# Patient Record
Sex: Male | Born: 1965 | Race: White | Hispanic: No | Smoking: Current every day smoker
Health system: Southern US, Community
[De-identification: ages and names within clinical notes are randomized; demographics above are authoritative.]

---

## 2019-09-30 ENCOUNTER — Encounter (HOSPITAL_BASED_OUTPATIENT_CLINIC_OR_DEPARTMENT_OTHER): Payer: Self-pay | Admitting: *Deleted

## 2019-09-30 ENCOUNTER — Other Ambulatory Visit: Payer: Self-pay

## 2019-09-30 ENCOUNTER — Observation Stay (HOSPITAL_BASED_OUTPATIENT_CLINIC_OR_DEPARTMENT_OTHER)
Admission: EM | Admit: 2019-09-30 | Discharge: 2019-10-02 | Disposition: A | Discharge status: Home / Self Care | Payer: Self-pay | Attending: Internal Medicine | Admitting: Internal Medicine

## 2019-09-30 DIAGNOSIS — Z20828 Contact with and (suspected) exposure to other viral communicable diseases: Secondary | ICD-10-CM | POA: Insufficient documentation

## 2019-09-30 DIAGNOSIS — R9431 Abnormal electrocardiogram [ECG] [EKG]: Secondary | ICD-10-CM

## 2019-09-30 DIAGNOSIS — E876 Hypokalemia: Secondary | ICD-10-CM | POA: Insufficient documentation

## 2019-09-30 DIAGNOSIS — E1110 Type 2 diabetes mellitus with ketoacidosis without coma: Principal | ICD-10-CM | POA: Insufficient documentation

## 2019-09-30 DIAGNOSIS — R0602 Shortness of breath: Secondary | ICD-10-CM

## 2019-09-30 DIAGNOSIS — I10 Essential (primary) hypertension: Secondary | ICD-10-CM | POA: Insufficient documentation

## 2019-09-30 DIAGNOSIS — R739 Hyperglycemia, unspecified: Secondary | ICD-10-CM | POA: Insufficient documentation

## 2019-09-30 DIAGNOSIS — R112 Nausea with vomiting, unspecified: Secondary | ICD-10-CM

## 2019-09-30 DIAGNOSIS — J069 Acute upper respiratory infection, unspecified: Secondary | ICD-10-CM | POA: Insufficient documentation

## 2019-09-30 DIAGNOSIS — E871 Hypo-osmolality and hyponatremia: Secondary | ICD-10-CM

## 2019-09-30 DIAGNOSIS — N179 Acute kidney failure, unspecified: Secondary | ICD-10-CM | POA: Insufficient documentation

## 2019-09-30 DIAGNOSIS — F17210 Nicotine dependence, cigarettes, uncomplicated: Secondary | ICD-10-CM | POA: Insufficient documentation

## 2019-09-30 DIAGNOSIS — I4589 Other specified conduction disorders: Secondary | ICD-10-CM | POA: Insufficient documentation

## 2019-09-30 LAB — URINALYSIS, MICROSCOPIC (REFLEX)

## 2019-09-30 LAB — COMPREHENSIVE METABOLIC PANEL
ALT: 37 U/L (ref 0–44)
AST: 21 U/L (ref 15–41)
Albumin: 4 g/dL (ref 3.5–5.0)
Alkaline Phosphatase: 124 U/L (ref 38–126)
Anion gap: 18 — ABNORMAL HIGH (ref 5–15)
BUN: 22 mg/dL — ABNORMAL HIGH (ref 6–20)
CO2: 21 mmol/L — ABNORMAL LOW (ref 22–32)
Calcium: 9.3 mg/dL (ref 8.9–10.3)
Chloride: 92 mmol/L — ABNORMAL LOW (ref 98–111)
Creatinine, Ser: 1.36 mg/dL — ABNORMAL HIGH (ref 0.61–1.24)
GFR calc Af Amer: >60 mL/min (ref >60)
GFR calc non Af Amer: 58 mL/min — ABNORMAL LOW (ref >60)
Glucose, Bld: 643 mg/dL (ref 70–99)
Potassium: 4.7 mmol/L (ref 3.5–5.1)
Sodium: 131 mmol/L — ABNORMAL LOW (ref 135–145)
Total Bilirubin: 1.3 mg/dL — ABNORMAL HIGH (ref 0.3–1.2)
Total Protein: 7.8 g/dL (ref 6.5–8.1)

## 2019-09-30 LAB — URINALYSIS, ROUTINE W REFLEX MICROSCOPIC
Bilirubin Urine: NEGATIVE
Glucose, UA: >=500 mg/dL — AB
Ketones, ur: 40 mg/dL — AB
Leukocytes,Ua: NEGATIVE
Nitrite: NEGATIVE
Protein, ur: NEGATIVE mg/dL
Specific Gravity, Urine: 1.02 (ref 1.005–1.030)
pH: 5 (ref 5.0–8.0)

## 2019-09-30 LAB — CBG MONITORING, ED
Glucose-Capillary: 394 mg/dL — ABNORMAL HIGH (ref 70–99)
Glucose-Capillary: 475 mg/dL — ABNORMAL HIGH (ref 70–99)
Glucose-Capillary: 278 mg/dL — ABNORMAL HIGH (ref 70–99)

## 2019-09-30 LAB — CBC
HCT: 49.1 % (ref 39.0–52.0)
Hemoglobin: 17.3 g/dL — ABNORMAL HIGH (ref 13.0–17.0)
MCH: 30.5 pg (ref 26.0–34.0)
MCHC: 35.2 g/dL (ref 30.0–36.0)
MCV: 86.4 fL (ref 80.0–100.0)
Platelets: 240 10*3/uL (ref 150–400)
RBC: 5.68 MIL/uL (ref 4.22–5.81)
RDW: 11.9 % (ref 11.5–15.5)
WBC: 9.5 10*3/uL (ref 4.0–10.5)
nRBC: 0 % (ref 0.0–0.2)

## 2019-09-30 LAB — SARS CORONAVIRUS 2 AG (30 MIN TAT): SARS Coronavirus 2 Ag: NEGATIVE

## 2019-09-30 LAB — LIPASE, BLOOD: Lipase: 31 U/L (ref 11–51)

## 2019-09-30 MED ORDER — DEXTROSE-NACL 5-0.45 % IV SOLN: INTRAVENOUS | Status: DC

## 2019-09-30 MED ORDER — POTASSIUM CHLORIDE 10 MEQ/100ML IV SOLN
10.0000 meq | INTRAVENOUS | Status: AC
  Administered 2019-09-30 (×2): 10 meq via INTRAVENOUS
  Filled 2019-09-30 (×2): qty 100

## 2019-09-30 MED ORDER — SODIUM CHLORIDE 0.9 % IV SOLN: INTRAVENOUS | Status: DC

## 2019-09-30 MED ORDER — FAMOTIDINE IN NACL 20-0.9 MG/50ML-% IV SOLN
20.0000 mg | Freq: Once | INTRAVENOUS | Status: AC
  Administered 2019-09-30: 20 mg via INTRAVENOUS
  Filled 2019-09-30: qty 50

## 2019-09-30 MED ORDER — SODIUM CHLORIDE 0.9 % IV BOLUS
1000.0000 mL | Freq: Once | INTRAVENOUS | Status: AC
  Administered 2019-09-30: 19:00:00 1000 mL via INTRAVENOUS

## 2019-09-30 MED ORDER — ONDANSETRON HCL 4 MG/2ML IJ SOLN
4.0000 mg | Freq: Once | INTRAMUSCULAR | Status: AC
  Administered 2019-09-30: 4 mg via INTRAVENOUS
  Filled 2019-09-30: qty 2

## 2019-09-30 MED ORDER — SODIUM CHLORIDE 0.9 % IV BOLUS
1000.0000 mL | Freq: Once | INTRAVENOUS | Status: AC
  Administered 2019-09-30: 1000 mL via INTRAVENOUS

## 2019-09-30 MED ORDER — DEXTROSE 50 % IV SOLN: 0.0000 mL | INTRAVENOUS | Status: DC | PRN

## 2019-09-30 MED ORDER — INSULIN REGULAR(HUMAN) IN NACL 100-0.9 UT/100ML-% IV SOLN
INTRAVENOUS | Status: AC
  Filled 2019-09-30: qty 100

## 2019-09-30 MED ORDER — INSULIN REGULAR(HUMAN) IN NACL 100-0.9 UT/100ML-% IV SOLN
INTRAVENOUS | Status: DC
  Administered 2019-09-30: 14 [IU]/h via INTRAVENOUS

## 2019-09-30 NOTE — ED Triage Notes (Signed)
Vomiting, sore throat, weakness, no appetite. His Covid test was negative 12/23.

## 2019-09-30 NOTE — ED Notes (Signed)
Attempted IV x 2- not successful

## 2019-09-30 NOTE — ED Notes (Signed)
Attempted IV in left FA unsuccessful.  

## 2019-09-30 NOTE — ED Provider Notes (Signed)
[facility] Emergency Department Provider Note MRN:  [id number]  Arrival date & time: 09/30/19     Chief Complaint   Emesis   History of Present Illness   Eduardo Abbott is a 53 y.o. year-old male with no pertinent past medical history presenting to the ED with chief complaint of emesis.  1 week of symptoms, began with general malaise, fatigue, low energy.  Has been having nausea and vomiting for the past 3 days.  Had a sore throat for the past 5 days, treated with penicillin by PCP, seems to be helping.  Denies diarrhea, no chest pain or shortness of breath, no cough, no abdominal pain.  Was tested for coronavirus few days ago and it was negative.  Here for continued vomiting.  Review of Systems  A complete 10 system review of systems was obtained and all systems are negative except as noted in the HPI and PMH.   Patient's Health History   History reviewed. No pertinent past medical history.  History reviewed. No pertinent surgical history.  No family history on file.  Social History   Socioeconomic History  . Marital status: Divorced    Spouse name: Not on file  . Number of children: Not on file  . Years of education: Not on file  . Highest education level: Not on file  Occupational History  . Not on file  Tobacco Use  . Smoking status: Current Every Day Smoker  . Smokeless tobacco: Never Used  Substance and Sexual Activity  . Alcohol use: Not Currently  . Drug use: Never  . Sexual activity: Not on file  Other Topics Concern  . Not on file  Social History Narrative  . Not on file   Social Determinants of Health   Financial Resource Strain:   . Difficulty of Paying Living Expenses: Not on file  Food Insecurity:   . Worried About Charity fundraiser in the Last Year: Not on file  . Ran Out of Food in the Last Year: Not on file  Transportation Needs:   . Lack of Transportation (Medical): Not on file  . Lack of Transportation  (Non-Medical): Not on file  Physical Activity:   . Days of Exercise per Week: Not on file  . Minutes of Exercise per Session: Not on file  Stress:   . Feeling of Stress : Not on file  Social Connections:   . Frequency of Communication with Friends and Family: Not on file  . Frequency of Social Gatherings with Friends and Family: Not on file  . Attends Religious Services: Not on file  . Active Member of Clubs or Organizations: Not on file  . Attends Archivist Meetings: Not on file  . Marital Status: Not on file  Intimate Partner Violence:   . Fear of Current or Ex-Partner: Not on file  . Emotionally Abused: Not on file  . Physically Abused: Not on file  . Sexually Abused: Not on file     Physical Exam  Vital Signs and Nursing Notes reviewed Vitals:   09/30/19 2030 09/30/19 2100  BP: (!) 148/98 (!) 133/98  Pulse: (!) 101 (!) 106  Resp: 17 18  Temp:    SpO2: 98% 95%    CONSTITUTIONAL: Well-appearing, NAD NEURO:  Alert and oriented x 3, normal and symmetric strength and sensation, normal coordination, normal speech EYES:  eyes equal and reactive ENT/NECK:  no LAD, no JVD CARDIO: Tachycardic rate, well-perfused, normal S1 and S2 PULM:  CTAB no wheezing or rhonchi GI/GU:  normal bowel sounds, non-distended, non-tender MSK/SPINE:  No gross deformities, no edema SKIN:  no rash, atraumatic PSYCH:  Appropriate speech and behavior  Diagnostic and Interventional Summary    EKG Interpretation  Date/Time:  Monday September 30 2019 19:11:22 EST Ventricular Rate:  114 PR Interval:    QRS Duration: 87 QT Interval:  359 QTC Calculation: 495 R Axis:   45 Text Interpretation: Sinus tachycardia Consider right atrial enlargement Borderline low voltage, extremity leads Borderline prolonged QT interval Baseline wander in lead(s) V1 V3 No previous ECGs available Confirmed by Kennis CarinaBero, Linzy Laury [phone]([id number]) on 09/30/2019 9:09:21 PM      Labs Reviewed  CBC - Abnormal; Notable for the  following components:      Result Value   Hemoglobin 17.3 (*)    All other components within normal limits  COMPREHENSIVE METABOLIC PANEL - Abnormal; Notable for the following components:   Sodium 131 (*)    Chloride 92 (*)    CO2 21 (*)    Glucose, Bld 643 (*)    BUN 22 (*)    Creatinine, Ser 1.36 (*)    Total Bilirubin 1.3 (*)    GFR calc non Af Amer 58 (*)    Anion gap 18 (*)    All other components within normal limits  SARS CORONAVIRUS 2 (TAT 6-24 HRS)  SARS CORONAVIRUS 2 AG (30 MIN TAT)  LIPASE, BLOOD  URINALYSIS, ROUTINE W REFLEX MICROSCOPIC    No orders to display    Medications  insulin regular, human (MYXREDLIN) 100 units/ 100 mL infusion (14 Units/hr Intravenous New Bag/Given 09/30/19 2148)  0.9 %  sodium chloride infusion (has no administration in time range)  dextrose 5 %-0.45 % sodium chloride infusion (has no administration in time range)  dextrose 50 % solution 0-50 mL (has no administration in time range)  potassium chloride 10 mEq in 100 mL IVPB (10 mEq Intravenous New Bag/Given 09/30/19 2158)  sodium chloride 0.9 % bolus 1,000 mL ( Intravenous Stopped 09/30/19 1925)  ondansetron (ZOFRAN) injection 4 mg (4 mg Intravenous Given 09/30/19 1923)  famotidine (PEPCID) IVPB 20 mg premix (0 mg Intravenous Stopped 09/30/19 2030)  sodium chloride 0.9 % bolus 1,000 mL (1,000 mLs Intravenous New Bag/Given 09/30/19 2147)     Procedures  /  Critical Care .Critical Care Performed by: Sabas SousBero, Dawnyel Leven M, MD Authorized by: Sabas SousBero, Charlotta Lapaglia M, MD   Critical care provider statement:    Critical care time (minutes):  37   Critical care was necessary to treat or prevent imminent or life-threatening deterioration of the following conditions:  Metabolic crisis (Diabetic ketoacidosis)   Critical care was time spent personally by me on the following activities:  Discussions with consultants, evaluation of patient's response to treatment, examination of patient, ordering and performing  treatments and interventions, ordering and review of laboratory studies, ordering and review of radiographic studies, pulse oximetry, re-evaluation of patient's condition, obtaining history from patient or surrogate and review of old charts    ED Course and Medical Decision Making  I have reviewed the triage vital signs and the nursing notes.  Pertinent labs & imaging results that were available during my care of the patient were reviewed by me and considered in my medical decision making (see below for details).     Tachycardic on arrival but well-appearing, suspect dehydration in the setting of continued vomiting.  Favoring viral etiology given patient's concomitant sore throat, general malaise.  Possibly COVID-19 though tested negative a  few days ago.  Nothing on exam to suggest CNS etiology, abdomen is soft and nontender, no surgical history.  Will screen for significant dehydration or metabolic disarray, attempt symptomatic management, anticipating discharge.  Labs reveal glucose of 643, bicarb of 18, elevated anion gap.  Consistent with new onset diabetes with mild DKA.  Will admit to hospitalist service for control of blood sugar and diabetic education.  Elmer Sow. Pilar Plate, MD [facility] Emergency Medicine [facility] [email or web address]  Final Clinical Impressions(s) / ED Diagnoses     ICD-10-CM   1. Diabetic ketoacidosis without coma associated with type 2 diabetes mellitus (HCC)  E11.10   2. Non-intractable vomiting with nausea, unspecified vomiting type  R11.2     ED Discharge Orders    None       Discharge Instructions Discussed with and Provided to Patient:   Discharge Instructions   None       Sabas Sous, MD 09/30/19 2159

## 2019-09-30 NOTE — ED Notes (Signed)
Date and time results received: 09/30/19 2035  Test: glucose Critical Value: 643  Name of Provider Notified: Dr. Sedonia Small  Orders Received? Or Actions Taken?: awaiting further orders

## 2019-09-30 NOTE — ED Notes (Signed)
ED Provider at bedside. 

## 2019-10-01 DIAGNOSIS — E1110 Type 2 diabetes mellitus with ketoacidosis without coma: Secondary | ICD-10-CM

## 2019-10-01 DIAGNOSIS — J029 Acute pharyngitis, unspecified: Secondary | ICD-10-CM

## 2019-10-01 DIAGNOSIS — E871 Hypo-osmolality and hyponatremia: Secondary | ICD-10-CM

## 2019-10-01 DIAGNOSIS — N179 Acute kidney failure, unspecified: Secondary | ICD-10-CM

## 2019-10-01 DIAGNOSIS — I10 Essential (primary) hypertension: Secondary | ICD-10-CM

## 2019-10-01 DIAGNOSIS — J069 Acute upper respiratory infection, unspecified: Secondary | ICD-10-CM

## 2019-10-01 DIAGNOSIS — R9431 Abnormal electrocardiogram [ECG] [EKG]: Secondary | ICD-10-CM

## 2019-10-01 LAB — HEMOGLOBIN A1C
Hgb A1c MFr Bld: 14.4 % — ABNORMAL HIGH (ref 4.8–5.6)
Mean Plasma Glucose: 366.58 mg/dL

## 2019-10-01 LAB — CBG MONITORING, ED
Glucose-Capillary: 128 mg/dL — ABNORMAL HIGH (ref 70–99)
Glucose-Capillary: 129 mg/dL — ABNORMAL HIGH (ref 70–99)
Glucose-Capillary: 148 mg/dL — ABNORMAL HIGH (ref 70–99)
Glucose-Capillary: 160 mg/dL — ABNORMAL HIGH (ref 70–99)
Glucose-Capillary: 166 mg/dL — ABNORMAL HIGH (ref 70–99)
Glucose-Capillary: 189 mg/dL — ABNORMAL HIGH (ref 70–99)
Glucose-Capillary: 236 mg/dL — ABNORMAL HIGH (ref 70–99)

## 2019-10-01 LAB — GLUCOSE, CAPILLARY
Glucose-Capillary: 194 mg/dL — ABNORMAL HIGH (ref 70–99)
Glucose-Capillary: 306 mg/dL — ABNORMAL HIGH (ref 70–99)
Glucose-Capillary: 290 mg/dL — ABNORMAL HIGH (ref 70–99)
Glucose-Capillary: 228 mg/dL — ABNORMAL HIGH (ref 70–99)

## 2019-10-01 LAB — BASIC METABOLIC PANEL
Anion gap: 5 (ref 5–15)
BUN: 14 mg/dL (ref 6–20)
CO2: 22 mmol/L (ref 22–32)
Calcium: 7.2 mg/dL — ABNORMAL LOW (ref 8.9–10.3)
Chloride: 113 mmol/L — ABNORMAL HIGH (ref 98–111)
Creatinine, Ser: 0.73 mg/dL (ref 0.61–1.24)
GFR calc Af Amer: >60 mL/min (ref >60)
GFR calc non Af Amer: >60 mL/min (ref >60)
Glucose, Bld: 143 mg/dL — ABNORMAL HIGH (ref 70–99)
Potassium: 3.3 mmol/L — ABNORMAL LOW (ref 3.5–5.1)
Sodium: 140 mmol/L (ref 135–145)

## 2019-10-01 LAB — COMPREHENSIVE METABOLIC PANEL
ALT: 29 U/L (ref 0–44)
AST: 18 U/L (ref 15–41)
Albumin: 3.1 g/dL — ABNORMAL LOW (ref 3.5–5.0)
Alkaline Phosphatase: 80 U/L (ref 38–126)
Anion gap: 12 (ref 5–15)
BUN: 13 mg/dL (ref 6–20)
CO2: 21 mmol/L — ABNORMAL LOW (ref 22–32)
Calcium: 7.9 mg/dL — ABNORMAL LOW (ref 8.9–10.3)
Chloride: 104 mmol/L (ref 98–111)
Creatinine, Ser: 0.82 mg/dL (ref 0.61–1.24)
GFR calc Af Amer: >60 mL/min (ref >60)
GFR calc non Af Amer: >60 mL/min (ref >60)
Glucose, Bld: 199 mg/dL — ABNORMAL HIGH (ref 70–99)
Potassium: 3.4 mmol/L — ABNORMAL LOW (ref 3.5–5.1)
Sodium: 137 mmol/L (ref 135–145)
Total Bilirubin: 0.6 mg/dL (ref 0.3–1.2)
Total Protein: 5.6 g/dL — ABNORMAL LOW (ref 6.5–8.1)

## 2019-10-01 LAB — CBC
HCT: 38.8 % — ABNORMAL LOW (ref 39.0–52.0)
Hemoglobin: 13.4 g/dL (ref 13.0–17.0)
MCH: 30.4 pg (ref 26.0–34.0)
MCHC: 34.5 g/dL (ref 30.0–36.0)
MCV: 88 fL (ref 80.0–100.0)
Platelets: 194 10*3/uL (ref 150–400)
RBC: 4.41 MIL/uL (ref 4.22–5.81)
RDW: 12 % (ref 11.5–15.5)
WBC: 9.5 10*3/uL (ref 4.0–10.5)
nRBC: 0 % (ref 0.0–0.2)

## 2019-10-01 LAB — HIV ANTIBODY (ROUTINE TESTING W REFLEX): HIV Screen 4th Generation wRfx: NONREACTIVE

## 2019-10-01 LAB — SARS CORONAVIRUS 2 (TAT 6-24 HRS): SARS Coronavirus 2: NEGATIVE

## 2019-10-01 LAB — MRSA PCR SCREENING: MRSA by PCR: NEGATIVE

## 2019-10-01 MED ORDER — INSULIN GLARGINE 100 UNIT/ML ~~LOC~~ SOLN
10.0000 [IU] | Freq: Every day | SUBCUTANEOUS | Status: DC
  Administered 2019-10-01: 10 [IU] via SUBCUTANEOUS
  Filled 2019-10-01 (×2): qty 0.1

## 2019-10-01 MED ORDER — INSULIN ASPART 100 UNIT/ML ~~LOC~~ SOLN
0.0000 [IU] | Freq: Every day | SUBCUTANEOUS | Status: DC
  Administered 2019-10-01: 23:00:00 2 [IU] via SUBCUTANEOUS

## 2019-10-01 MED ORDER — MAGNESIUM SULFATE 2 GM/50ML IV SOLN
2.0000 g | Freq: Once | INTRAVENOUS | Status: AC
  Administered 2019-10-01: 11:00:00 2 g via INTRAVENOUS
  Filled 2019-10-01: qty 50

## 2019-10-01 MED ORDER — PANTOPRAZOLE SODIUM 40 MG PO TBEC
40.0000 mg | DELAYED_RELEASE_TABLET | Freq: Every day | ORAL | Status: DC
  Administered 2019-10-01 – 2019-10-02 (×2): 40 mg via ORAL
  Filled 2019-10-01 (×2): qty 1

## 2019-10-01 MED ORDER — INSULIN ASPART 100 UNIT/ML ~~LOC~~ SOLN
0.0000 [IU] | Freq: Three times a day (TID) | SUBCUTANEOUS | Status: DC
  Administered 2019-10-01: 8 [IU] via SUBCUTANEOUS
  Administered 2019-10-01: 13:00:00 11 [IU] via SUBCUTANEOUS
  Administered 2019-10-02: 5 [IU] via SUBCUTANEOUS
  Administered 2019-10-02: 3 [IU] via SUBCUTANEOUS

## 2019-10-01 MED ORDER — SENNOSIDES-DOCUSATE SODIUM 8.6-50 MG PO TABS: 1.0000 | ORAL_TABLET | Freq: Every evening | ORAL | Status: DC | PRN

## 2019-10-01 MED ORDER — ACETAMINOPHEN 650 MG RE SUPP: 650.0000 mg | Freq: Four times a day (QID) | RECTAL | Status: DC | PRN

## 2019-10-01 MED ORDER — PENICILLIN V POTASSIUM 250 MG PO TABS
500.0000 mg | ORAL_TABLET | Freq: Two times a day (BID) | ORAL | Status: DC
  Administered 2019-10-01 – 2019-10-02 (×3): 500 mg via ORAL
  Filled 2019-10-01 (×5): qty 2

## 2019-10-01 MED ORDER — CHLORHEXIDINE GLUCONATE CLOTH 2 % EX PADS
6.0000 | MEDICATED_PAD | Freq: Every day | CUTANEOUS | Status: DC
  Administered 2019-10-01 – 2019-10-02 (×2): 6 via TOPICAL

## 2019-10-01 MED ORDER — ACETAMINOPHEN 325 MG PO TABS: 650.0000 mg | ORAL_TABLET | Freq: Four times a day (QID) | ORAL | Status: DC | PRN

## 2019-10-01 MED ORDER — LISINOPRIL 5 MG PO TABS
5.0000 mg | ORAL_TABLET | Freq: Every day | ORAL | Status: DC
  Administered 2019-10-01 – 2019-10-02 (×2): 5 mg via ORAL
  Filled 2019-10-01: qty 2
  Filled 2019-10-01: qty 1

## 2019-10-01 MED ORDER — POTASSIUM CHLORIDE 20 MEQ PO PACK
40.0000 meq | PACK | Freq: Once | ORAL | Status: AC
  Administered 2019-10-01: 40 meq via ORAL
  Filled 2019-10-01: qty 2

## 2019-10-01 MED ORDER — ENOXAPARIN SODIUM 40 MG/0.4ML ~~LOC~~ SOLN
40.0000 mg | SUBCUTANEOUS | Status: DC
  Administered 2019-10-01: 40 mg via SUBCUTANEOUS
  Filled 2019-10-01: qty 0.4

## 2019-10-01 MED ORDER — LIVING WELL WITH DIABETES BOOK
Freq: Once | Status: AC
  Filled 2019-10-01: qty 1

## 2019-10-01 MED ORDER — FAMOTIDINE IN NACL 20-0.9 MG/50ML-% IV SOLN
20.0000 mg | Freq: Two times a day (BID) | INTRAVENOUS | Status: DC
  Administered 2019-10-01: 10:00:00 20 mg via INTRAVENOUS
  Filled 2019-10-01: qty 50

## 2019-10-01 NOTE — ED Notes (Signed)
EMS arrived to transport pt to [facility]. CBG rechecked. Pt VSS.

## 2019-10-01 NOTE — ED Notes (Signed)
Pt provided ice chips.

## 2019-10-01 NOTE — ED Notes (Signed)
[facility] notified (Tara) - patient ready for transport 

## 2019-10-01 NOTE — ED Notes (Signed)
Report given to EMS at bedside.

## 2019-10-01 NOTE — ED Notes (Signed)
Contacted EMS for transport to [facility]

## 2019-10-01 NOTE — H&P (Addendum)
History and Physical    DOA: 09/30/2019  PCP: Patient, No Pcp Per  Patient coming from: home  Chief Complaint: nausea/vomiting x 3 days  HPI: Eduardo Abbott is a 53 y.o. male with with no pertinent past medical history presenting to the ED with chief complaint of feeling ill with sore throat last week and vomiting over the last 3 days. He had seen his PCP for sore throat and prescribed Penicillin (finished 5 out of 10-day course)-states sore throat is much improved. Also c/o Polyuria/polydipsia for last few days without dysuria.Denies diarrhea, no chest pain or shortness of breath, no cough, no abdominal pain.  Was tested for coronavirus few days ago and it was negative. ED course: Afebrile, SBP 140s to 150s,  Lab work revealed serum glucose 643, bicarb 21, anion gap 18, BUN 22, creatinine 1.36, Lipase 31. Potassium on initial lab work at 4.7 and dropped to 3.3 on repeat test at 2 am. Urinalysis shows Glucose >500, ketones 40, rare bacteria without pyuria. Patient given 2 L normal saline, IV K 10x2, and started on insulin infusion protocol at [facility]. Admission requested for new onset Diabetes with DKA versus HHS. Screening SARS-CoV-2 Ag (30 min test) negative. Patient arrived to stepdown floor this morning and BG appears improved.   Review of Systems: As per HPI otherwise 10 point review of systems negative.    History reviewed. No pertinent past medical history.  History reviewed. No pertinent surgical history.  Social history:  reports that he has been smoking. He has never used smokeless tobacco. He reports previous alcohol use. He reports that he does not use drugs.   No Known Allergies  Family history: Denies any history of diabetes and close family members.  Has 2 sisters and 1 brother who are relatively healthy.  Reports history of artificial heart valve replacement in father.   Prior to Admission medications   Not on File    Physical Exam: Vitals:   10/01/19 0500 10/01/19  0530 10/01/19 0735 10/01/19 0800  BP: 128/79 105/72 (!) 148/86 (!) 141/88  Pulse: 82 83 86 83  Resp: 18 17 15 12   Temp:   98.2 F (36.8 C)   TempSrc:   Oral   SpO2: 98% 97% 94% 95%  Weight:   83.2 kg   Height:   5\' 10"  (1.778 m)     Constitutional: NAD, calm, comfortable Eyes: PERRL, lids and conjunctivae normal ENMT: Mild posterior pharyngeal erythema, without any evidence of oral candidiasis.  Mucous membranes are moist. Posterior pharynx clear of any exudate or lesions.Normal dentition.  Neck: normal, supple, no masses, no thyromegaly Respiratory: clear to auscultation bilaterally, no wheezing, no crackles. Normal respiratory effort. No accessory muscle use.  Cardiovascular: Regular rate and rhythm, no murmurs / rubs / gallops. No extremity edema. 2+ pedal pulses. No carotid bruits.  Abdomen: no tenderness, no masses palpated. No hepatosplenomegaly. Bowel sounds positive.  Musculoskeletal: no clubbing / cyanosis. No joint deformity upper and lower extremities. Good ROM, no contractures. Normal muscle tone.  Neurologic: CN 2-12 grossly intact. Sensation intact, DTR normal. Strength 5/5 in all 4.  Psychiatric: Normal judgment and insight. Alert and oriented x 3. Normal mood.  SKIN/catheters: no rashes, lesions, ulcers. No induration  Labs on Admission: I have personally reviewed following labs and imaging studies  CBC: Recent Labs  Lab 09/30/19 1930  WBC 9.5  HGB 17.3*  HCT 49.1  MCV 86.4  PLT 694   Basic Metabolic Panel: Recent Labs  Lab 09/30/19 1930 10/01/19  0239  NA 131* 140  K 4.7 3.3*  CL 92* 113*  CO2 21* 22  GLUCOSE 643* 143*  BUN 22* 14  CREATININE 1.36* 0.73  CALCIUM 9.3 7.2*   GFR: Estimated Creatinine Clearance: 107.7 mL/min (by C-G formula based on SCr of 0.73 mg/dL). Liver Function Tests: Recent Labs  Lab 09/30/19 1930  AST 21  ALT 37  ALKPHOS 124  BILITOT 1.3*  PROT 7.8  ALBUMIN 4.0   Recent Labs  Lab 09/30/19 1930  LIPASE 31   No  results for input(s): AMMONIA in the last 168 hours. Coagulation Profile: No results for input(s): INR, PROTIME in the last 168 hours. Cardiac Enzymes: No results for input(s): CKTOTAL, CKMB, CKMBINDEX, TROPONINI in the last 168 hours. BNP (last 3 results) No results for input(s): PROBNP in the last 8760 hours. HbA1C: No results for input(s): HGBA1C in the last 72 hours. CBG: Recent Labs  Lab 10/01/19 0335 10/01/19 0425 10/01/19 0533 10/01/19 0633 10/01/19 0725  GLUCAP 160* 148* 166* 129* 194*   Lipid Profile: No results for input(s): CHOL, HDL, LDLCALC, TRIG, CHOLHDL, LDLDIRECT in the last 72 hours. Thyroid Function Tests: No results for input(s): TSH, T4TOTAL, FREET4, T3FREE, THYROIDAB in the last 72 hours. Anemia Panel: No results for input(s): VITAMINB12, FOLATE, FERRITIN, TIBC, IRON, RETICCTPCT in the last 72 hours. Urine analysis:    Component Value Date/Time   COLORURINE YELLOW 09/30/2019 2300   APPEARANCEUR CLEAR 09/30/2019 2300   LABSPEC 1.020 09/30/2019 2300   PHURINE 5.0 09/30/2019 2300   GLUCOSEU >=500 (A) 09/30/2019 2300   HGBUR TRACE (A) 09/30/2019 2300   BILIRUBINUR NEGATIVE 09/30/2019 2300   KETONESUR 40 (A) 09/30/2019 2300   PROTEINUR NEGATIVE 09/30/2019 2300   NITRITE NEGATIVE 09/30/2019 2300   LEUKOCYTESUR NEGATIVE 09/30/2019 2300    Radiological Exams on Admission: Personally reviewed  No results found.  EKG: Independently reviewed. Sinus tachycardia, borderline prolonged qtc     Assessment and Plan:   Principal Problem:   DM (diabetes mellitus) with ketoacidosis (HCC) Active Problems:   Upper respiratory infection   AKI (acute kidney injury) (HCC)   Hyponatremia   Prolonged QT interval   HTN (hypertension)    1. New onset DM with mild DKA : POA. Bicarb/AG normalized on labs done early this morning around 2 AM. BG improved.  Plan was to transition insulin drip to SQ insulin Lantus this morning but notified by nurse that insulin drip  was discontinued prior to transferring patient here.  Not sure if he got bridging subcu insulin prior to transfer here.  Will start on Lantus 10 units, diet ordered. Diabetes education. Check HgbA1c, repeat BMP  2. Acute kidney injury: POA in the setting of acute illness, polyuria and dehydration. Now resolved with IV fluids  3. Recent upper respiratory illness : likely precipitated DKA with underlying undiagnosed Diabetes. Complete PCN abx course for another 5 days.   4. Hypokalemia: in the setting of IV hydration and insulin drip. Repeat labs this morning and replace as needed. Will give po potassium as well.  5. HTN: SBP in 140s to 150s. Can start lisinopril low dose as renal function now normalized although U/A does not show any proteinuria.   6. Nausea/vomiting: secondary to #1. Continue Pepcid/antiemetics prn. Lipase wnl.   7. Pseudohyponatremia: POA in the setting of hyperglycemia. Now resolved.  8. Borderline prolonged qtc: Avoid qt prolonging agents, replace potassium, mag x1 and repeat EKG in am for f/u  9. Tobacco use: counseled to quit.  DVT prophylaxis: lovenox  COVID screen: Confirmatory pending,. POC -ve  Code Status: Full code .Health care proxy would be mother  Patient/Family Communication: Discussed with patient and all questions answered to satisfaction.  Consults called: Diabetes educator Admission status :I certify that at the point of admission it is my clinical judgment that the patient will require inpatient hospital care spanning beyond 2 midnights from the point of admission due to high intensity of service and high frequency of surveillance required.Inpatient status is judged to be reasonable and necessary in order to provide the required intensity of service to ensure the patient's safety. The patient's presenting symptoms, physical exam findings, and initial radiographic and laboratory data in the context of their chronic comorbidities is felt to place them at  high risk for further clinical deterioration. The following factors support the patient status of inpatient : New diagnosis DM with mild DKA/AKI requiring insulin drip and IV fluids-can be transferred to MedSurg bed once off insulin drip. Expected LOS:  2 days    Alessandra Bevels MD [facility] Pager [phone]  If 7PM-7AM, please contact night-coverage [email or web address] Password [facility]  10/01/2019, 8:31 AM

## 2019-10-01 NOTE — Progress Notes (Signed)
Inpatient Diabetes Program Recommendations  AACE/ADA: New Consensus Statement on Inpatient Glycemic Control (2015)  Target Ranges:  Prepandial:   less than 140 mg/dL      Peak postprandial:   less than 180 mg/dL (1-2 hours)      Critically ill patients:  140 - 180 mg/dL   Lab Results  Component Value Date   GLUCAP 306 (H) 10/01/2019   HGBA1C 14.4 (H) 10/01/2019    Review of Glycemic Control  Diabetes history: None Outpatient Diabetes medications: N/A Current orders for Inpatient glycemic control: Lantus 10 units QD, Novolog 0-15 units tidwc and 0-5 units QHS  HgbA1C - 14.4% Will need to go home on insulin. Pt does not have insurance and will need affordable insulin (Novolin 70/30 pen - $44 at [facility]) Ordered Living Well with Diabetes book and will order insulin pen starter kit. TOC consult for PCP to manage his diabetes.  Have tried several times to call into room and no answer. Do not see cell phone number, only home number. Will ask RN to do bedside teaching and insulin pen administration. Continue to try and speak with pt.  Inpatient Diabetes Program Recommendations:     Starting tomorrow am, 70/30 12 units bid (D/C Lantus) Will need to titrate 70/30 dose. I suspect pt will need more insulin at home.   For discharge:  Will need blood glucose meter - #[id number] Insulin pen needles - #[id number] ReliOn Novolin 70/30 pens ReliOn Novolin R pens (for s/s)  Ordered TOC consult for PCP to manage new-onset DM.  Continue to follow. Will see pt when transferred to floor.  Thank you. Rhonda Vaughan, RD, LDN, CDE Inpatient Diabetes Coordinator [phone]    

## 2019-10-02 ENCOUNTER — Observation Stay (HOSPITAL_COMMUNITY): Payer: Self-pay

## 2019-10-02 DIAGNOSIS — R112 Nausea with vomiting, unspecified: Secondary | ICD-10-CM

## 2019-10-02 LAB — GLUCOSE, CAPILLARY
Glucose-Capillary: 224 mg/dL — ABNORMAL HIGH (ref 70–99)
Glucose-Capillary: 190 mg/dL — ABNORMAL HIGH (ref 70–99)

## 2019-10-02 LAB — BASIC METABOLIC PANEL
Anion gap: 9 (ref 5–15)
BUN: 12 mg/dL (ref 6–20)
CO2: 21 mmol/L — ABNORMAL LOW (ref 22–32)
Calcium: 7.8 mg/dL — ABNORMAL LOW (ref 8.9–10.3)
Chloride: 101 mmol/L (ref 98–111)
Creatinine, Ser: 0.71 mg/dL (ref 0.61–1.24)
GFR calc Af Amer: >60 mL/min (ref >60)
GFR calc non Af Amer: >60 mL/min (ref >60)
Glucose, Bld: 229 mg/dL — ABNORMAL HIGH (ref 70–99)
Potassium: 3.3 mmol/L — ABNORMAL LOW (ref 3.5–5.1)
Sodium: 131 mmol/L — ABNORMAL LOW (ref 135–145)

## 2019-10-02 LAB — C-REACTIVE PROTEIN: CRP: 0.9 mg/dL (ref <1.0)

## 2019-10-02 LAB — FERRITIN: Ferritin: 443 ng/mL — ABNORMAL HIGH (ref 24–336)

## 2019-10-02 LAB — PROCALCITONIN: Procalcitonin: <0.1 ng/mL

## 2019-10-02 MED ORDER — POTASSIUM CHLORIDE CRYS ER 20 MEQ PO TBCR
40.0000 meq | EXTENDED_RELEASE_TABLET | Freq: Once | ORAL | Status: AC
  Administered 2019-10-02: 40 meq via ORAL
  Filled 2019-10-02: qty 2

## 2019-10-02 MED ORDER — NOVOLIN 70/30 FLEXPEN RELION (70-30) 100 UNIT/ML ~~LOC~~ SUPN: 12.0000 [IU] | PEN_INJECTOR | Freq: Two times a day (BID) | SUBCUTANEOUS | 0 refills | Status: DC

## 2019-10-02 MED ORDER — NOVOLIN 70/30 FLEXPEN RELION (70-30) 100 UNIT/ML ~~LOC~~ SUPN: 15.0000 [IU] | PEN_INJECTOR | Freq: Two times a day (BID) | SUBCUTANEOUS | 0 refills | Status: AC

## 2019-10-02 MED ORDER — NOVOLIN R FLEXPEN RELION 100 UNIT/ML IJ SOPN: 2.0000 [IU] | PEN_INJECTOR | Freq: Three times a day (TID) | INTRAMUSCULAR | 0 refills | Status: AC

## 2019-10-02 MED ORDER — INSULIN ASPART PROT & ASPART (70-30 MIX) 100 UNIT/ML ~~LOC~~ SUSP
12.0000 [IU] | Freq: Two times a day (BID) | SUBCUTANEOUS | Status: DC
  Administered 2019-10-02: 10:00:00 12 [IU] via SUBCUTANEOUS
  Filled 2019-10-02: qty 10

## 2019-10-02 MED ORDER — PENICILLIN V POTASSIUM 500 MG PO TABS: 500.0000 mg | ORAL_TABLET | Freq: Two times a day (BID) | ORAL | 0 refills | Status: DC

## 2019-10-02 MED ORDER — BLOOD GLUCOSE METER KIT: PACK | 0 refills | Status: AC

## 2019-10-02 MED ORDER — INSULIN PEN NEEDLE 31G X 5 MM MISC: 1.0000 | Freq: Two times a day (BID) | 0 refills | Status: DC

## 2019-10-02 MED ORDER — LISINOPRIL 5 MG PO TABS: 5.0000 mg | ORAL_TABLET | Freq: Every day | ORAL | 0 refills | Status: DC

## 2019-10-02 MED ORDER — LISINOPRIL 5 MG PO TABS: 5.0000 mg | ORAL_TABLET | Freq: Every day | ORAL | 0 refills | Status: AC

## 2019-10-02 MED ORDER — INSULIN PEN NEEDLE 31G X 5 MM MISC: 1.0000 | Freq: Two times a day (BID) | 0 refills | Status: AC

## 2019-10-02 MED ORDER — PENICILLIN V POTASSIUM 500 MG PO TABS: 500.0000 mg | ORAL_TABLET | Freq: Two times a day (BID) | ORAL | 0 refills | Status: AC

## 2019-10-02 NOTE — Discharge Summary (Signed)
Physician Discharge Summary  Jamair Cato ZOX:[id number] DOB: 1966-06-09 DOA: 09/30/2019  PCP: Patient, No Pcp Per  Admit date: 09/30/2019 Discharge date: 10/02/2019  Admitted From: Home Disposition:  Home  Recommendations for Outpatient Follow-up:  1. Follow up with PCP in 1-2 weeks  Discharge Condition:Improved CODE STATUS:Full Diet recommendation: Diabetic   Brief/Interim Summary: 53 y.o. male withwithno pertinent past medical historypresenting to the ED with chief complaint of feeling ill with sore throat last week and vomiting over the last 3 days. He had seen his PCP for sore throat and prescribed Penicillin (finished 5 out of 10-day course)-states sore throat is much improved.Also c/o Polyuria/polydipsia for last few days without dysuria.Denies diarrhea, no chest pain or shortness of breath, no cough, no abdominal pain. Was tested for coronavirus few days ago and it was negative. ED course: Afebrile, SBP 140s to 150s,  Lab work revealed serum glucose 643, bicarb 21, anion gap 18, BUN 22, creatinine 1.36, Lipase 31. Potassium on initial lab work at 4.7 and dropped to 3.3 on repeat test at 2 am. Urinalysis shows Glucose >500, ketones 40, rare bacteria without pyuria. Patient given 2 L normal saline, IV K 10x2, and started on insulin infusion protocol at [facility]. Admission requested for new onset Diabetes with DKA versus HHS. Screening SARS-CoV-2 Ag (30 min test) negative. Patient arrived to stepdown floor this morning and BG appears improved.   Discharge Diagnoses:  Principal Problem:   DM (diabetes mellitus) with ketoacidosis ([place]) Active Problems:   Upper respiratory infection   AKI (acute kidney injury) ([place])   Hyponatremia   Prolonged QT interval   HTN (hypertension)   1. New onset DM with mild DKA : POA. DKA resolved prior to transfer to [facility] and pt was transitioned off insulin gtt onto lantus. Diabetic coordinator consulted. Pt recommended to transition to 70/30  insulin, now dosed at 15 units BID along with sliding scale pen. Pt remained medically stable. Most recent glucose at time of dictation was 190. Of note, A1c noted to be 14.4. Will discharge on lisinopril for renal protection  2. Acute kidney injury: POA in the setting of acute illness, polyuria and dehydration. Now resolved with IV fluids  3. Recent upper respiratory illness : likely precipitated DKA with underlying undiagnosed Diabetes. Complete PCN abx course for 4 more days after d/c.   4. Hypokalemia: in the setting of IV hydration and insulin drip. Replaced  5. HTN: SBP in 140s to 150s. Can start lisinopril low dose as renal function now normalized although U/A does not show any proteinuria.   6. Nausea/vomiting: secondary to #1. Continue Pepcid/antiemetics prn. Lipase wnl. Resolved  7. Pseudohyponatremia: POA in the setting of hyperglycemia. Now resolved.  8. Borderline prolonged qtc: Replaced potassium, mag x1. Repeat EKG personally reviewed, QTc now normalized  9. Tobacco use: counseled to quit.   Discharge Instructions   Allergies as of 10/02/2019   No Known Allergies     Medication List    TAKE these medications   blood glucose meter kit and supplies Dispense based on patient and insurance preference. Use up to four times daily as directed. (FOR ICD-10 E10.9, E11.9).   Insulin Pen Needle 31G X 5 MM Misc 1 Device by Does not apply route 2 (two) times daily. For use with your insulin pens   lisinopril 5 MG tablet Commonly known as: ZESTRIL Take 1 tablet (5 mg total) by mouth daily. Start taking on: October 03, 2019   NovoLIN 70/30 FlexPen Relion (70-30) 100 UNIT/ML PEN  Generic drug: Insulin Isophane & Regular Human Inject 12 Units into the skin 2 (two) times daily.   NovoLIN R FlexPen ReliOn 100 UNIT/ML Sopn Generic drug: Insulin Regular Human Inject 2-15 Units as directed 3 (three) times daily before meals. Sliding Scale: CBG 70 - 120: 0 units  CBG  121 - 150: 2 units  CBG 151 - 200: 3 units  CBG 201 - 250: 5 units  CBG 251 - 300: 8 units  CBG 301 - 350: 11 units  CBG 351 - 400: 15 units   penicillin v potassium 500 MG tablet Commonly known as: VEETID Take 1 tablet (500 mg total) by mouth 2 (two) times daily for 4 days.      Follow-up Information    Follow up with your PCP in 1-2 weeks. Schedule an appointment as soon as possible for a visit.          No Known Allergies  Procedures/Studies: DG CHEST PORT 1 VIEW  Result Date: 10/02/2019 CLINICAL DATA:  Shortness of breath.  Diabetes. EXAM: PORTABLE CHEST 1 VIEW COMPARISON:  No pertinent prior studies available for comparison. FINDINGS: Subtle linear opacities at the bilateral lung bases have an appearance most suggestive of atelectasis and/or scarring. No definite airspace disease. No evidence of pleural effusion or pneumothorax. No acute bony abnormality. IMPRESSION: Subtle linear opacities at the bilateral lung bases have an appearance most suggestive of atelectasis and/or scarring. No definite airspace disease. Electronically Signed   By: Kellie Simmering DO   On: 10/02/2019 10:43     Subjective: Eager to go home  Discharge Exam: Vitals:   10/01/19 2041 10/02/19 0701  BP: 108/76 122/73  Pulse: 73 76  Resp: 17 17  Temp: 98 F (36.7 C) 98.2 F (36.8 C)  SpO2: 96% 97%   Vitals:   10/01/19 1900 10/01/19 1903 10/01/19 2041 10/02/19 0701  BP: 106/64  108/76 122/73  Pulse: 78  73 76  Resp: '17  17 17  '$ Temp:  98.6 F (37 C) 98 F (36.7 C) 98.2 F (36.8 C)  TempSrc:  Oral Oral Oral  SpO2: 93%  96% 97%  Weight:      Height:        General: Pt is alert, awake, not in acute distress Cardiovascular: RRR, S1/S2 +, no rubs, no gallops Respiratory: CTA bilaterally, no wheezing, no rhonchi Abdominal: Soft, NT, ND, bowel sounds + Extremities: no edema, no cyanosis   The results of significant diagnostics from this hospitalization (including imaging, microbiology,  ancillary and laboratory) are listed below for reference.     Microbiology: Recent Results (from the past 240 hour(s))  SARS CORONAVIRUS 2 (TAT 6-24 HRS) Nasopharyngeal Nasopharyngeal Swab     Status: None   Collection Time: 09/30/19 10:04 PM   Specimen: Nasopharyngeal Swab  Result Value Ref Range Status   SARS Coronavirus 2 NEGATIVE NEGATIVE Final    Comment: (NOTE) SARS-CoV-2 target nucleic acids are NOT DETECTED. The SARS-CoV-2 RNA is generally detectable in upper and lower respiratory specimens during the acute phase of infection. Negative results do not preclude SARS-CoV-2 infection, do not rule out co-infections with other pathogens, and should not be used as the sole basis for treatment or other patient management decisions. Negative results must be combined with clinical observations, patient history, and epidemiological information. The expected result is Negative. Fact Sheet for Patients: SugarRoll.be Fact Sheet for Healthcare Providers: https://www.woods-mathews.com/ This test is not yet approved or cleared by the [place] and  has been authorized  for detection and/or diagnosis of SARS-CoV-2 by FDA under an Emergency Use Authorization (EUA). This EUA will remain  in effect (meaning this test can be used) for the duration of the COVID-19 declaration under Section 56 4(b)(1) of the Act, 21 U.S.C. section 360bbb-3(b)(1), unless the authorization is terminated or revoked sooner. Performed at [facility], [place] [address] [place], [place] [address]   SARS Coronavirus 2 Ag (30 min TAT) - Nasopharyngeal Swab     Status: None   Collection Time: 09/30/19 10:04 PM   Specimen: Nasopharyngeal Swab; Nasal Swab  Result Value Ref Range Status   SARS Coronavirus 2 Ag NEGATIVE NEGATIVE Final    Comment: (NOTE) SARS-CoV-2 antigen NOT DETECTED.  Negative results are presumptive.  Negative results do not preclude SARS-CoV-2  infection and should not be used as the sole basis for treatment or other patient management decisions, including infection  control decisions, particularly in the presence of clinical signs and  symptoms consistent with COVID-19, or in those who have been in contact with the virus.  Negative results must be combined with clinical observations, patient history, and epidemiological information. The expected result is Negative. Fact Sheet for Patients: PodPark.tn Fact Sheet for Healthcare Providers: GiftContent.is This test is not yet approved or cleared by the [place] FDA and  has been authorized for detection and/or diagnosis of SARS-CoV-2 by FDA under an Emergency Use Authorization (EUA).  This EUA will remain in effect (meaning this test can be used) for the duration of  the COVID-19 de claration under Section 564(b)(1) of the Act, 21 U.S.C. section 360bbb-3(b)(1), unless the authorization is terminated or revoked sooner. Performed at [facility], [address] [place], [place] [address]   MRSA PCR Screening     Status: None   Collection Time: 10/01/19  7:56 AM   Specimen: Nasopharyngeal  Result Value Ref Range Status   MRSA by PCR NEGATIVE NEGATIVE Final    Comment:        The GeneXpert MRSA Assay (FDA approved for NASAL specimens only), is one component of a comprehensive MRSA colonization surveillance program. It is not intended to diagnose MRSA infection nor to guide or monitor treatment for MRSA infections. Performed at [facility], [place] [address], [place], [place] [address]      Labs: BNP (last 3 results) No results for input(s): BNP in the last 8760 hours. Basic Metabolic Panel: Recent Labs  Lab 09/30/19 1930 10/01/19 0239 10/01/19 0828 10/02/19 0523  NA 131* 140 137 131*  K 4.7 3.3* 3.4* 3.3*  CL 92* 113* 104 101  CO2 21* 22 21* 21*  GLUCOSE 643* 143* 199*  229*  BUN 22* '14 13 12  '$ CREATININE 1.36* 0.73 0.82 0.71  CALCIUM 9.3 7.2* 7.9* 7.8*   Liver Function Tests: Recent Labs  Lab 09/30/19 1930 10/01/19 0828  AST 21 18  ALT 37 29  ALKPHOS 124 80  BILITOT 1.3* 0.6  PROT 7.8 5.6*  ALBUMIN 4.0 3.1*   Recent Labs  Lab 09/30/19 1930  LIPASE 31   No results for input(s): AMMONIA in the last 168 hours. CBC: Recent Labs  Lab 09/30/19 1930 10/01/19 1149  WBC 9.5 9.5  HGB 17.3* 13.4  HCT 49.1 38.8*  MCV 86.4 88.0  PLT 240 194   Cardiac Enzymes: No results for input(s): CKTOTAL, CKMB, CKMBINDEX, TROPONINI in the last 168 hours. BNP: Invalid input(s): POCBNP CBG: Recent Labs  Lab 10/01/19 1202 10/01/19 1626 10/01/19 2047  10/02/19 0736 10/02/19 1208  GLUCAP 306* 290* 228* 224* 190*   D-Dimer No results for input(s): DDIMER in the last 72 hours. Hgb A1c Recent Labs    10/01/19 0831  HGBA1C 14.4*   Lipid Profile No results for input(s): CHOL, HDL, LDLCALC, TRIG, CHOLHDL, LDLDIRECT in the last 72 hours. Thyroid function studies No results for input(s): TSH, T4TOTAL, T3FREE, THYROIDAB in the last 72 hours.  Invalid input(s): FREET3 Anemia work up Recent Labs    10/02/19 1059  FERRITIN 443*   Urinalysis    Component Value Date/Time   COLORURINE YELLOW 09/30/2019 2300   APPEARANCEUR CLEAR 09/30/2019 2300   LABSPEC 1.020 09/30/2019 2300   PHURINE 5.0 09/30/2019 2300   GLUCOSEU >=500 (A) 09/30/2019 2300   HGBUR TRACE (A) 09/30/2019 2300   BILIRUBINUR NEGATIVE 09/30/2019 2300   KETONESUR 40 (A) 09/30/2019 2300   PROTEINUR NEGATIVE 09/30/2019 2300   NITRITE NEGATIVE 09/30/2019 2300   LEUKOCYTESUR NEGATIVE 09/30/2019 2300   Sepsis Labs Invalid input(s): PROCALCITONIN,  WBC,  LACTICIDVEN Microbiology Recent Results (from the past 240 hour(s))  SARS CORONAVIRUS 2 (TAT 6-24 HRS) Nasopharyngeal Nasopharyngeal Swab     Status: None   Collection Time: 09/30/19 10:04 PM   Specimen: Nasopharyngeal Swab  Result  Value Ref Range Status   SARS Coronavirus 2 NEGATIVE NEGATIVE Final    Comment: (NOTE) SARS-CoV-2 target nucleic acids are NOT DETECTED. The SARS-CoV-2 RNA is generally detectable in upper and lower respiratory specimens during the acute phase of infection. Negative results do not preclude SARS-CoV-2 infection, do not rule out co-infections with other pathogens, and should not be used as the sole basis for treatment or other patient management decisions. Negative results must be combined with clinical observations, patient history, and epidemiological information. The expected result is Negative. Fact Sheet for Patients: SugarRoll.be Fact Sheet for Healthcare Providers: https://www.woods-mathews.com/ This test is not yet approved or cleared by the [place] FDA and  has been authorized for detection and/or diagnosis of SARS-CoV-2 by FDA under an Emergency Use Authorization (EUA). This EUA will remain  in effect (meaning this test can be used) for the duration of the COVID-19 declaration under Section 56 4(b)(1) of the Act, 21 U.S.C. section 360bbb-3(b)(1), unless the authorization is terminated or revoked sooner. Performed at [facility], [place] [address] [place], [place] [address]   SARS Coronavirus 2 Ag (30 min TAT) - Nasopharyngeal Swab     Status: None   Collection Time: 09/30/19 10:04 PM   Specimen: Nasopharyngeal Swab; Nasal Swab  Result Value Ref Range Status   SARS Coronavirus 2 Ag NEGATIVE NEGATIVE Final    Comment: (NOTE) SARS-CoV-2 antigen NOT DETECTED.  Negative results are presumptive.  Negative results do not preclude SARS-CoV-2 infection and should not be used as the sole basis for treatment or other patient management decisions, including infection  control decisions, particularly in the presence of clinical signs and  symptoms consistent with COVID-19, or in those who have been in contact with the virus.   Negative results must be combined with clinical observations, patient history, and epidemiological information. The expected result is Negative. Fact Sheet for Patients: PodPark.tn Fact Sheet for Healthcare Providers: GiftContent.is This test is not yet approved or cleared by the [place] FDA and  has been authorized for detection and/or diagnosis of SARS-CoV-2 by FDA under an Emergency Use Authorization (EUA).  This EUA will remain in effect (meaning this test can be used) for the duration of  the COVID-19 de  claration under Section 564(b)(1) of the Act, 21 U.S.C. section 360bbb-3(b)(1), unless the authorization is terminated or revoked sooner. Performed at [facility], [address] [place], [place] [address]   MRSA PCR Screening     Status: None   Collection Time: 10/01/19  7:56 AM   Specimen: Nasopharyngeal  Result Value Ref Range Status   MRSA by PCR NEGATIVE NEGATIVE Final    Comment:        The GeneXpert MRSA Assay (FDA approved for NASAL specimens only), is one component of a comprehensive MRSA colonization surveillance program. It is not intended to diagnose MRSA infection nor to guide or monitor treatment for MRSA infections. Performed at [facility], [place] [address] [place], [place] [address]    Time spent: 30 min  SIGNED:   Marylu Lund, MD  [facility] 10/02/2019, 1:50 PM  If 7PM-7AM, please contact night-coverage

## 2021-01-01 IMAGING — DX DG CHEST 1V PORT
1 series · 1 of 1 positions shown · non-contrast
Comparison: No pertinent prior studies available for comparison.

CLINICAL DATA: Shortness of breath.  Diabetes.

EXAM:
PORTABLE CHEST 1 VIEW

[chest ap]
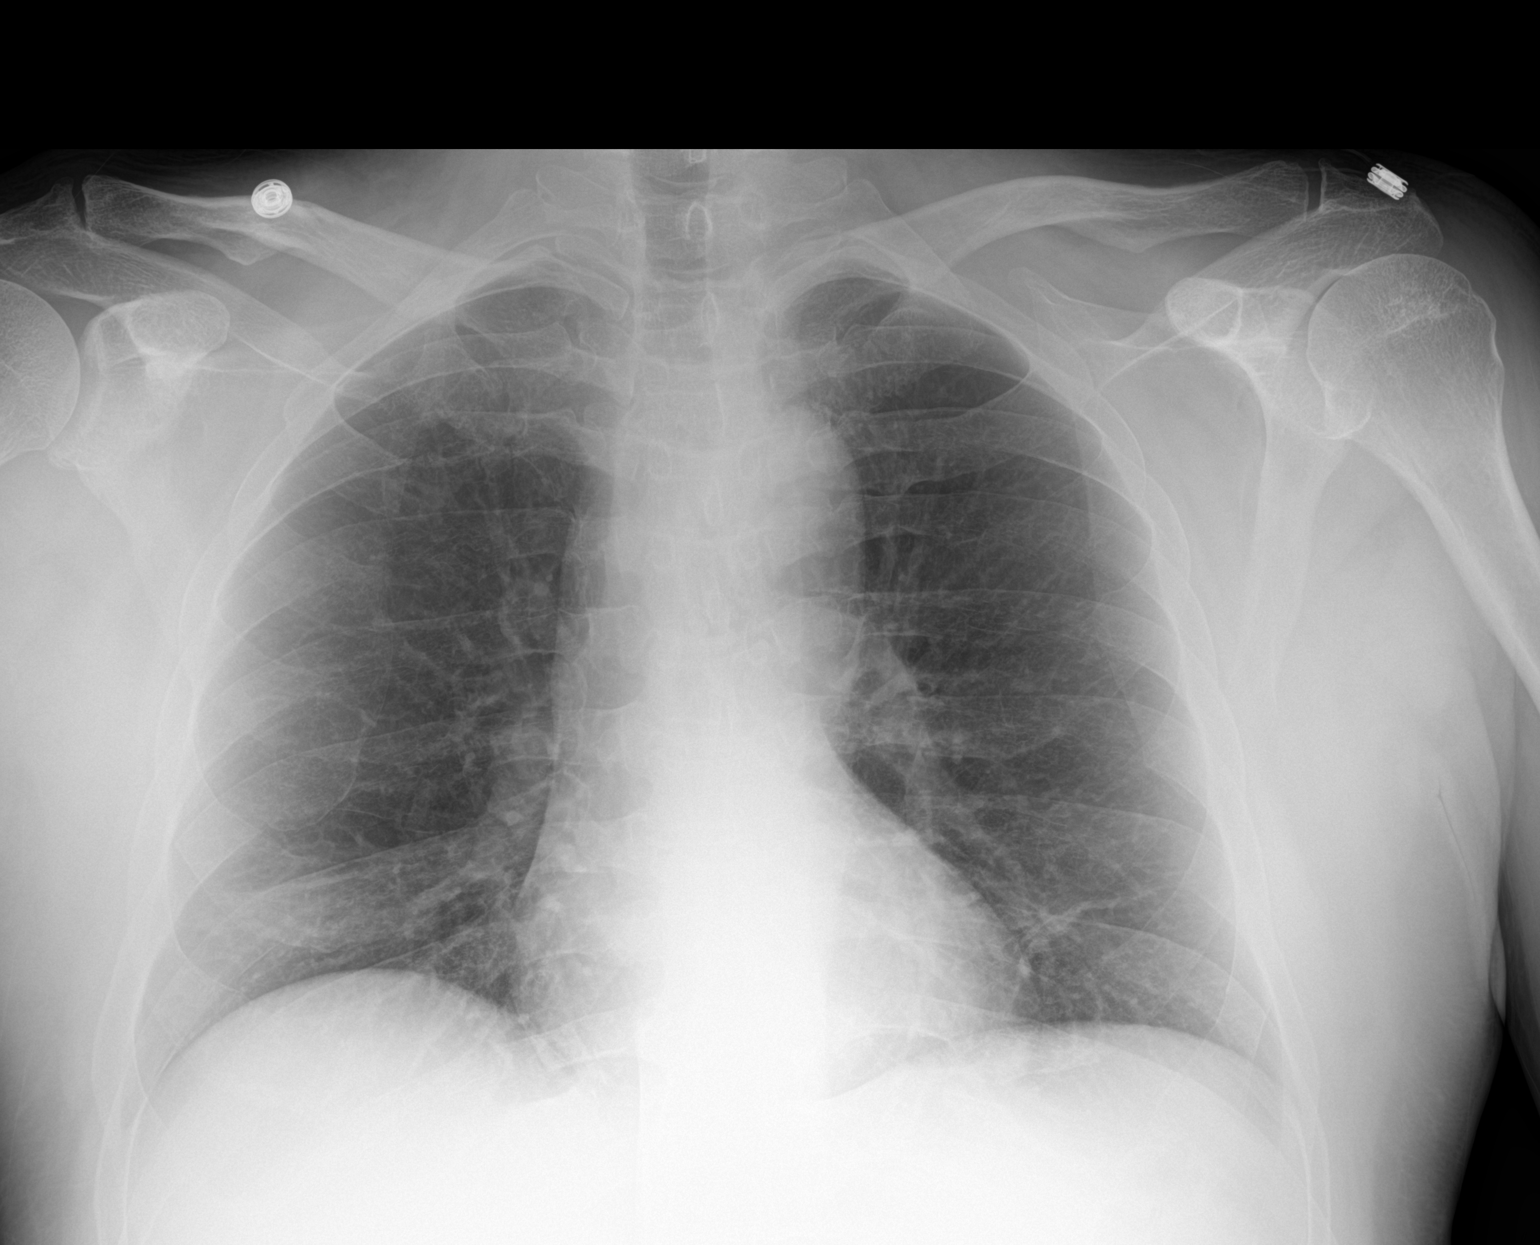

[1 of 1 positions shown; findings below may reference images not displayed]

FINDINGS: Subtle linear opacities at the bilateral lung bases have an
appearance most suggestive of atelectasis and/or scarring. No
definite airspace disease. No evidence of pleural effusion or
pneumothorax. No acute bony abnormality.
IMPRESSION: Subtle linear opacities at the bilateral lung bases have an
appearance most suggestive of atelectasis and/or scarring. No
definite airspace disease.
